# Patient Record
Sex: Female | Born: 1935 | Race: White | Hispanic: No | Smoking: Never smoker
Health system: Southern US, Community
[De-identification: ages and names within clinical notes are randomized; demographics above are authoritative.]

## PROBLEM LIST (undated history)

## (undated) DIAGNOSIS — M1990 Unspecified osteoarthritis, unspecified site: Secondary | ICD-10-CM

## (undated) DIAGNOSIS — E079 Disorder of thyroid, unspecified: Secondary | ICD-10-CM

## (undated) DIAGNOSIS — F0390 Unspecified dementia without behavioral disturbance: Secondary | ICD-10-CM

## (undated) DIAGNOSIS — I10 Essential (primary) hypertension: Secondary | ICD-10-CM

## (undated) HISTORY — PX: KNEE SURGERY: SHX244

---

## 2012-06-13 ENCOUNTER — Emergency Department (HOSPITAL_BASED_OUTPATIENT_CLINIC_OR_DEPARTMENT_OTHER)
Admission: EM | Admit: 2012-06-13 | Discharge: 2012-06-13 | Disposition: A | Discharge status: Home / Self Care | Payer: Medicare Other | Attending: Emergency Medicine | Admitting: Emergency Medicine

## 2012-06-13 ENCOUNTER — Encounter (HOSPITAL_BASED_OUTPATIENT_CLINIC_OR_DEPARTMENT_OTHER): Payer: Self-pay | Admitting: *Deleted

## 2012-06-13 DIAGNOSIS — Y998 Other external cause status: Secondary | ICD-10-CM | POA: Insufficient documentation

## 2012-06-13 DIAGNOSIS — W540XXA Bitten by dog, initial encounter: Secondary | ICD-10-CM | POA: Insufficient documentation

## 2012-06-13 DIAGNOSIS — M129 Arthropathy, unspecified: Secondary | ICD-10-CM | POA: Insufficient documentation

## 2012-06-13 DIAGNOSIS — F0390 Unspecified dementia without behavioral disturbance: Secondary | ICD-10-CM | POA: Insufficient documentation

## 2012-06-13 DIAGNOSIS — I10 Essential (primary) hypertension: Secondary | ICD-10-CM | POA: Insufficient documentation

## 2012-06-13 DIAGNOSIS — S81811A Laceration without foreign body, right lower leg, initial encounter: Secondary | ICD-10-CM

## 2012-06-13 DIAGNOSIS — E079 Disorder of thyroid, unspecified: Secondary | ICD-10-CM | POA: Insufficient documentation

## 2012-06-13 DIAGNOSIS — Y93E9 Activity, other interior property and clothing maintenance: Secondary | ICD-10-CM | POA: Insufficient documentation

## 2012-06-13 DIAGNOSIS — S81009A Unspecified open wound, unspecified knee, initial encounter: Secondary | ICD-10-CM | POA: Insufficient documentation

## 2012-06-13 HISTORY — DX: Essential (primary) hypertension: I10

## 2012-06-13 HISTORY — DX: Disorder of thyroid, unspecified: E07.9

## 2012-06-13 HISTORY — DX: Unspecified osteoarthritis, unspecified site: M19.90

## 2012-06-13 MED ORDER — LIDOCAINE-EPINEPHRINE 2 %-1:100000 IJ SOLN
20.0000 mL | Freq: Once | INTRAMUSCULAR | Status: AC
  Administered 2012-06-13: 20 mL
  Filled 2012-06-13: qty 1

## 2012-06-13 MED ORDER — AMOXICILLIN-POT CLAVULANATE 875-125 MG PO TABS: 1.0000 | ORAL_TABLET | Freq: Two times a day (BID) | ORAL | Status: DC

## 2012-06-13 MED ORDER — AMOXICILLIN-POT CLAVULANATE 875-125 MG PO TABS
1.0000 | ORAL_TABLET | Freq: Once | ORAL | Status: AC
  Administered 2012-06-13: 1 via ORAL
  Filled 2012-06-13: qty 1

## 2012-06-13 NOTE — ED Notes (Signed)
Patient had two wounds to left lower leg; sutures placed per MD; applied steri strips X2 to lateral laceration; bacitracin applied; 4X4 and gauze wrap applied.

## 2012-06-13 NOTE — ED Notes (Signed)
Pt to room 6 in w/c. Daughter reports pt bitten on right lower leg by her dog, states dog is utd on rabies and all immunizations. Pt assisted to bed and out of pants, into gown. approx 2" crecent sized laceration noted to right outer anterior shin. approx 2" x 1" open laceration noted to inner aspect of right anterior shin, both with minimal active bleeding. Wounds cleansed with wound cleanser pt tolerated well.

## 2012-06-13 NOTE — ED Provider Notes (Signed)
History     CSN: [id number]  Arrival date & time 06/13/12  4098   First MD Initiated Contact with Patient 06/13/12 [phone]      Chief Complaint  Patient presents with  . Animal Bite    (Consider location/radiation/quality/duration/timing/severity/associated sxs/prior treatment) Patient is a 78 y.o. female presenting with animal bite.  Animal Bite    Patient is a 78 year old female with history of dementia who presents today with her daughter for evaluation of a dog bite. Patient gets exercise by walking and sometimes doing limited housework today. Today the patient was trying to sweep when she started sweeping right around the family dog. Patient was bitten on her right shin and calf. The dogs immunizations are all up-to-date. Patient has a 2 inch crescent shaped wound over the right lateral shin with minimal bleeding that is nearly completely approximated. Patient also has a 2 inch laceration over the right medial calf with some minimal bleeding but with one and a half centimeters of gapping of the wound edges. Wound still appears to be relatively superficial. Patient did not have any other injuries and did not fall as a result of the incident. Her last tetanus shot was in 2004. Patient is at her neurologic baseline. There are no bony deformities noted. Pulses are intact distally. There are no other associated or modifying factors. Past Medical History  Diagnosis Date  . Thyroid disease   . Hypertension   . Arthritis     History reviewed. No pertinent past surgical history.  History reviewed. No pertinent family history.  History  Substance Use Topics  . Smoking status: Never Smoker   . Smokeless tobacco: Not on file  . Alcohol Use:     OB History    Grav Para Term Preterm Abortions TAB SAB Ect Mult Living                  Review of Systems  Constitutional: Negative.   HENT: Negative.   Eyes: Negative.   Respiratory: Negative.   Cardiovascular: Negative.     Gastrointestinal: Negative.   Genitourinary: Negative.   Musculoskeletal: Negative.   Skin: Positive for wound.  Neurological: Negative.   Hematological: Negative.   Psychiatric/Behavioral: Negative.   All other systems reviewed and are negative.    Allergies  Codeine  Home Medications   Current Outpatient Rx  Name Route Sig Dispense Refill  . BUSPIRONE HCL 5 MG PO TABS Oral Take 5 mg by mouth 3 (three) times daily.    . CELECOXIB 100 MG PO CAPS Oral Take 100 mg by mouth 2 (two) times daily.    Marland Kitchen HYDROCHLOROTHIAZIDE 12.5 MG PO TABS Oral Take 12.5 mg by mouth daily.    Marland Kitchen LEVOTHYROXINE SODIUM 50 MCG PO TABS Oral Take 50 mcg by mouth daily.    Marland Kitchen LOSARTAN POTASSIUM 25 MG PO TABS Oral Take 25 mg by mouth daily.    Marland Kitchen SIMVASTATIN 20 MG PO TABS Oral Take 40 mg by mouth every evening.       BP 136/68  Pulse 81  Temp 98.2 F (36.8 C) (Oral)  Resp 20  Ht 5' (1.524 m)  Wt 115 lb (52.164 kg)  BMI 22.46 kg/m2  SpO2 97%  Physical Exam  Nursing note and vitals reviewed. GEN: Well-developed, thin, elderly female in no distress HEENT: Atraumatic, normocephalic.  EYES: PERRLA BL, no scleral icterus. NECK: Trachea midline, no meningismus CV: regular rate and rhythm.  PULM: No respiratory distress.   Neuro: cranial nerves grossly  2-12 intact, no abnormalities of strength or sensation, alert and had neurologic baseline MSK: Patient moves all 4 extremities symmetrically Skin: No rashes petechiae, purpura, or jaundice Psych: no abnormality of mood   ED Course  Procedures (including critical care time)  LACERATION REPAIR Performed by: Cyndra Numbers Authorized by: Cyndra Numbers Consent: Verbal consent obtained. Risks and benefits: risks, benefits and alternatives were discussed Consent given by: patient Patient identity confirmed: provided demographic data Prepped and Draped in normal sterile fashion Wound explored  Laceration Location: right lower leg  Laceration Length: 4.4  cm on right lateral shin with 1 mm of gapping, 5.5 cm on medial right calf with some tissue avulsion and 1-1/2 cm of gapping  No Foreign Bodies seen or palpated  Anesthesia: local infiltration  Local anesthetic: lidocaine 2 % with epinephrine  Anesthetic total: 5 ml  Irrigation method: syringe Amount of cleaning: standard  Skin closure: 4-0 Ethilon was used as well as Steri-Strips   Number of sutures: 2 sutures were placed in the right medial calf wound with one Steri-Strip applied to the right anterior shin wound   Technique: Standard application of Steri-Strips was used as well as sterile technique and simple interrupted suture placement for the medial calf wound   Patient tolerance: Patient tolerated the procedure well with no immediate complications.  Labs Reviewed - No data to display No results found.   1. Dog bite   2. Lacerations of multiple sites of right leg       MDM  Patient was evaluated by myself. Based on evaluation patient had 2 skin tears secondary to dog bite. I think the quality of the patient's skin explains the extent of the wound today. She did not have any underlying ecchymosis or bony deformity. Patient did not require imaging. Patient had a tetanus shot that was up-to-date. Given tissue avulsion on the medial calf wound patient did have 2 simple interrupted sutures placed to loosely approximate the wound. One Steri-Strip was placed at the midpoint of the right anterior shin wound for the same reason. Patient had bacitracin applied to the wounds. Patient was bandage to. She received a dose of Augmentin here and was placed on this for the next 7 days. Given her age and diabetes which was reported by family patient was told to see her regular doctor in 48 hours for wound recheck to ensure no interim development of infection. Additionally they were told that the patient didn't see any physician in 7-10 days for suture removal. Patient has dementia and her daughter  reported that she had taken Celebrex for her arthritis today. They did not want anything for pain today. Patient can continue to take over-the-counter Tylenol with her Celebrex at home as needed for pain. Patient was discharged in good condition.        Cyndra Numbers, MD 06/13/12 1049

## 2012-06-13 NOTE — Discharge Instructions (Signed)
Please change bandage on your leg every 12 hours and reapply antibiotic ointment (either Neosporin or bacitracin) at this time. Animal Bite An animal bite can result in a scratch on the skin, deep open cut, puncture of the skin, crush injury, or tearing away of the skin or a body part. Dogs are responsible for most animal bites. Children are bitten more often than adults. An animal bite can range from very mild to more serious. A small bite from your house pet is no cause for alarm. However, some animal bites can become infected or injure a bone or other tissue. You must seek medical care if:  The skin is broken and bleeding does not slow down or stop after 15 minutes.   The puncture is deep and difficult to clean (such as a cat bite).   Pain, warmth, redness, or pus develops around the wound.   The bite is from a stray animal or rodent. There may be a risk of rabies infection.   The bite is from a snake, raccoon, skunk, fox, coyote, or bat. There may be a risk of rabies infection.   The person bitten has a chronic illness such as diabetes, liver disease, or cancer, or the person takes medicine that lowers the immune system.   There is concern about the location and severity of the bite.  It is important to clean and protect an animal bite wound right away to prevent infection. Follow these steps:  Clean the wound with plenty of water and soap.   Apply an antibiotic cream.   Apply gentle pressure over the wound with a clean towel or gauze to slow or stop bleeding.   Elevate the affected area above the heart to help stop any bleeding.   Seek medical care. Getting medical care within 8 hours of the animal bite leads to the best possible outcome.  DIAGNOSIS  Your caregiver will most likely:  Take a detailed history of the animal and the bite injury.   Perform a wound exam.   Take your medical history.  Blood tests or X-rays may be performed. Sometimes, infected bite wounds are  cultured and sent to a lab to identify the infectious bacteria.  TREATMENT  Medical treatment will depend on the location and type of animal bite as well as the patient's medical history. Treatment may include:  Wound care, such as cleaning and flushing the wound with saline solution, bandaging, and elevating the affected area.   Antibiotics.   Tetanus immunization.   Rabies immunization.   Leaving the wound open to heal. This is often done with animal bites, due to the high risk of infection. However, in certain cases, wound closure with stitches, wound adhesive, skin adhesive strips, or staples may be used.  Infected bites that are left untreated may require intravenous (IV) antibiotics and surgical treatment in the hospital. HOME CARE INSTRUCTIONS  Follow your caregiver's instructions for wound care.   Take all medicines as directed.   If your caregiver prescribes antibiotics, take them as directed. Finish them even if you start to feel better.   Follow up with your caregiver for further exams or immunizations as directed.  You may need a tetanus shot if:  You cannot remember when you had your last tetanus shot.   You have never had a tetanus shot.   The injury broke your skin.  If you get a tetanus shot, your arm may swell, get red, and feel warm to the touch. This is common  and not a problem. If you need a tetanus shot and you choose not to have one, there is a rare chance of getting tetanus. Sickness from tetanus can be serious. SEEK MEDICAL CARE IF:  You notice warmth, redness, soreness, swelling, pus discharge, or a bad smell coming from the wound.   You have a red line on the skin coming from the wound.   You have a fever, chills, or a general ill feeling.   You have nausea or vomiting.   You have continued or worsening pain.   You have trouble moving the injured part.   You have other questions or concerns.  MAKE SURE YOU:  Understand these instructions.    Will watch your condition.   Will get help right away if you are not doing well or get worse.  Document Released: 08/23/2011 Document Revised: 11/24/2011 Document Reviewed: 08/23/2011 [facility] Patient Information 2012 [place], [place].Laceration Care, Adult A laceration is a cut or lesion that goes through all layers of the skin and into the tissue just beneath the skin. TREATMENT  Some lacerations may not require closure. Some lacerations may not be able to be closed due to an increased risk of infection. It is important to see your caregiver as soon as possible after an injury to minimize the risk of infection and maximize the opportunity for successful closure. If closure is appropriate, pain medicines may be given, if needed. The wound will be cleaned to help prevent infection. Your caregiver will use stitches (sutures), staples, wound glue (adhesive), or skin adhesive strips to repair the laceration. These tools bring the skin edges together to allow for faster healing and a better cosmetic outcome. However, all wounds will heal with a scar. Once the wound has healed, scarring can be minimized by covering the wound with sunscreen during the day for 1 full year. HOME CARE INSTRUCTIONS  For sutures or staples:  Keep the wound clean and dry.   If you were given a bandage (dressing), you should change it at least once a day. Also, change the dressing if it becomes wet or dirty, or as directed by your caregiver.   Wash the wound with soap and water 2 times a day. Rinse the wound off with water to remove all soap. Pat the wound dry with a clean towel.   After cleaning, apply a thin layer of the antibiotic ointment as recommended by your caregiver. This will help prevent infection and keep the dressing from sticking.   You may shower as usual after the first 24 hours. Do not soak the wound in water until the sutures are removed.   Only take over-the-counter or prescription medicines for pain,  discomfort, or fever as directed by your caregiver.   Get your sutures or staples removed as directed by your caregiver.  For skin adhesive strips:  Keep the wound clean and dry.   Do not get the skin adhesive strips wet. You may bathe carefully, using caution to keep the wound dry.   If the wound gets wet, pat it dry with a clean towel.   Skin adhesive strips will fall off on their own. You may trim the strips as the wound heals. Do not remove skin adhesive strips that are still stuck to the wound. They will fall off in time.  For wound adhesive:  You may briefly wet your wound in the shower or bath. Do not soak or scrub the wound. Do not swim. Avoid periods of heavy perspiration until  the skin adhesive has fallen off on its own. After showering or bathing, gently pat the wound dry with a clean towel.   Do not apply liquid medicine, cream medicine, or ointment medicine to your wound while the skin adhesive is in place. This may loosen the film before your wound is healed.   If a dressing is placed over the wound, be careful not to apply tape directly over the skin adhesive. This may cause the adhesive to be pulled off before the wound is healed.   Avoid prolonged exposure to sunlight or tanning lamps while the skin adhesive is in place. Exposure to ultraviolet light in the first year will darken the scar.   The skin adhesive will usually remain in place for 5 to 10 days, then naturally fall off the skin. Do not pick at the adhesive film.  You may need a tetanus shot if:  You cannot remember when you had your last tetanus shot.   You have never had a tetanus shot.  If you get a tetanus shot, your arm may swell, get red, and feel warm to the touch. This is common and not a problem. If you need a tetanus shot and you choose not to have one, there is a rare chance of getting tetanus. Sickness from tetanus can be serious. SEEK MEDICAL CARE IF:   You have redness, swelling, or increasing  pain in the wound.   You see a red line that goes away from the wound.   You have yellowish-white fluid (pus) coming from the wound.   You have a fever.   You notice a bad smell coming from the wound or dressing.   Your wound breaks open before or after sutures have been removed.   You notice something coming out of the wound such as wood or glass.   Your wound is on your hand or foot and you cannot move a finger or toe.  SEEK IMMEDIATE MEDICAL CARE IF:   Your pain is not controlled with prescribed medicine.   You have severe swelling around the wound causing pain and numbness or a change in color in your arm, hand, leg, or foot.   Your wound splits open and starts bleeding.   You have worsening numbness, weakness, or loss of function of any joint around or beyond the wound.   You develop painful lumps near the wound or on the skin anywhere on your body.  MAKE SURE YOU:   Understand these instructions.   Will watch your condition.   Will get help right away if you are not doing well or get worse.  Document Released: 12/05/2005 Document Revised: 11/24/2011 Document Reviewed: 05/31/2011 [facility] Patient Information 2012 [place], [place].

## 2012-06-22 ENCOUNTER — Encounter (HOSPITAL_BASED_OUTPATIENT_CLINIC_OR_DEPARTMENT_OTHER): Payer: Self-pay | Admitting: *Deleted

## 2012-06-22 ENCOUNTER — Emergency Department (HOSPITAL_BASED_OUTPATIENT_CLINIC_OR_DEPARTMENT_OTHER)
Admission: EM | Admit: 2012-06-22 | Discharge: 2012-06-22 | Disposition: A | Discharge status: Home / Self Care | Payer: Medicare Other | Attending: Emergency Medicine | Admitting: Emergency Medicine

## 2012-06-22 DIAGNOSIS — Z4802 Encounter for removal of sutures: Secondary | ICD-10-CM | POA: Insufficient documentation

## 2012-06-22 DIAGNOSIS — W540XXA Bitten by dog, initial encounter: Secondary | ICD-10-CM

## 2012-06-22 NOTE — ED Provider Notes (Signed)
I saw and evaluated the patient, reviewed the resident's note and I agree with the findings and plan.   .Face to face Exam:  General:  Awake HEENT:  Atraumatic Resp:  Normal effort Abd:  Nondistended Neuro:No focal weakness Lymph: No adenopathy   Nelia Shi, MD 06/22/12 1106

## 2012-06-22 NOTE — ED Provider Notes (Signed)
History     CSN: [id number]  Arrival date & time 06/22/12  5188   First MD Initiated Contact with Patient 06/22/12 1033      Chief Complaint  Patient presents with  . Suture / Staple Removal    (Consider location/radiation/quality/duration/timing/severity/associated sxs/prior treatment) Patient is a 78 y.o. female presenting with suture removal.  Suture / Staple Removal     Mrs. Bultman is a 78 y.o. F with dementia who presents for wound evaluation and suture removal. She was bitten by a dog on 6/26 and had two wounds on her right lower extremity. Sutures were placed on the anterior-medial wound and the lateral-anterior wound was closed with steri-strips. Since that time her daughter has been caring for her wound with daily application of bacitracin and a bandage. Her daughter denies any drainage from the wound or dehiscence. The patient has been afebrile and without complications since the bite.    Past Medical History  Diagnosis Date  . Thyroid disease   . Hypertension   . Arthritis     History reviewed. No pertinent past surgical history.  History reviewed. No pertinent family history.  History  Substance Use Topics  . Smoking status: Never Smoker   . Smokeless tobacco: Not on file  . Alcohol Use:     OB History    Grav Para Term Preterm Abortions TAB SAB Ect Mult Living                  Review of Systems  All other systems reviewed and are negative.    Allergies  Codeine  Home Medications   Current Outpatient Rx  Name Route Sig Dispense Refill  . AMOXICILLIN-POT CLAVULANATE 875-125 MG PO TABS Oral Take 1 tablet by mouth 2 (two) times daily. 14 tablet 0  . BUSPIRONE HCL 5 MG PO TABS Oral Take 5 mg by mouth 3 (three) times daily.    . CELECOXIB 100 MG PO CAPS Oral Take 100 mg by mouth 2 (two) times daily.    Marland Kitchen HYDROCHLOROTHIAZIDE 12.5 MG PO TABS Oral Take 12.5 mg by mouth daily.    Marland Kitchen LEVOTHYROXINE SODIUM 50 MCG PO TABS Oral Take 50 mcg by mouth daily.     Marland Kitchen LOSARTAN POTASSIUM 25 MG PO TABS Oral Take 25 mg by mouth daily.    Marland Kitchen SIMVASTATIN 20 MG PO TABS Oral Take 40 mg by mouth every evening.       BP 122/59  Pulse 77  Temp 98 F (36.7 C) (Oral)  Resp 18  SpO2 96%  Physical Exam  Constitutional: She appears well-developed and well-nourished. No distress.  Skin: She is not diaphoretic.     Psychiatric: She has a normal mood and affect. Her behavior is normal. Her speech is not delayed. Cognition and memory are impaired. She exhibits abnormal recent memory and abnormal remote memory.    ED Course  SUTURE REMOVAL Date/Time: 06/22/2012 10:56 AM Performed by: Garnetta Buddy Authorized by: Garnetta Buddy Consent: Verbal consent obtained. Written consent not obtained. Risks and benefits: risks, benefits and alternatives were discussed Consent given by: power of attorney Body area: lower extremity Wound Appearance: clean and pink Sutures Removed: 2 Staples Removed: 0 Post-removal: Steri-Strips applied Facility: sutures placed in this facility Patient tolerance: Patient tolerated the procedure well with no immediate complications.   (including critical care time)  Labs Reviewed - No data to display No results found.   1. Dog bite       MDM  The patient has two well healing lower extremity wounds from a dog bite on 6/26. Her sutures were removed and steri-strips were placed. No need for continued antibiotics at this point as no evidence of infection.         Garnetta Buddy, MD 06/22/12 1104

## 2012-06-22 NOTE — ED Notes (Signed)
Pt amb to room 12 with slow steady gait in nad. Daughter reports she was told to have sutures to rle removed in 7 - 10 days, today is day 9. Sutures are dry and intact.

## 2013-05-03 ENCOUNTER — Encounter: Payer: Self-pay | Admitting: Podiatry

## 2013-05-03 ENCOUNTER — Ambulatory Visit (INDEPENDENT_AMBULATORY_CARE_PROVIDER_SITE_OTHER): Payer: Medicare Other | Admitting: Podiatry

## 2013-05-03 VITALS — BP 104/51 | HR 63

## 2013-05-03 DIAGNOSIS — L8990 Pressure ulcer of unspecified site, unspecified stage: Secondary | ICD-10-CM

## 2013-05-03 DIAGNOSIS — I739 Peripheral vascular disease, unspecified: Secondary | ICD-10-CM

## 2013-05-03 DIAGNOSIS — B351 Tinea unguium: Secondary | ICD-10-CM | POA: Insufficient documentation

## 2013-05-03 DIAGNOSIS — L8992 Pressure ulcer of unspecified site, stage 2: Secondary | ICD-10-CM | POA: Insufficient documentation

## 2013-05-03 DIAGNOSIS — M25579 Pain in unspecified ankle and joints of unspecified foot: Secondary | ICD-10-CM

## 2013-05-03 DIAGNOSIS — L84 Corns and callosities: Secondary | ICD-10-CM

## 2013-05-03 NOTE — Progress Notes (Signed)
She fell ln knee on April 27 and got more edema on lower limbs.  Taking amoxicillin stating yesterday and caused diarrhea.  No enough room in shoes for swelling. Developed a lesion on right great toe.  Callus under 1st MPJ and light callus under 5th right. Dystrophic nails on both great toes.  Amputated 2nd toe right in 2006 due toe none healing ulcer.  Edema both feet and leg R>L.  SUBJECTIVE: 79 y.o. year old female accompanied by her daughter presents for a lesion on right great toe. Patient is wearing diabetic shoes.  Patient is referred by Dr. Andria Meuse.  Daughter stated that patient fell on 4/27 and hurt right knee that resulted in swelling of right lower limb.  She was also treated with antibiotics for the blister on right great toe. Started on Amoxicillin yesterday, which made her to have diarrhea.   Reviewed and noted Medications, Allergies, Medical and Surgical histories.   OBJECTIVE: DERMATOLOGIC EXAMINATION: Nails: Severely dystrophic nail on both great toes. Skin: Dry bled callus prominent in size on right distal dorsal aspect of the hallux.  Thick hard protruding callus under the first MPJ right foot.   VASCULAR EXAMINATION OF LOWER LIMBS: Pedal pulses: All pedal pulses are not palpable all areas of feet, including DP and PT bilateral.  Swollen lower limb R>L without temperature increase. Both feet are cool. NEUROLOGIC EXAMINATION OF THE LOWER LIMBS: Fail to response to Monofilament sensory testing bilateral.  MUSCULOSKELETAL EXAMINATION: Loss of 2nd digit right due to none healing ulcer 2006. Hyperextended right hallux with dorsal distal lesion.  ASSESSMENT: Ulcerating callus right great toe with loss of dermal layer. Stage II. No associated soft tissue swelling, edema or erythema.  Pre-ulcerative lesion sub 1 right foot. Deformed mycotic nails both great toe. PVD. Peripheral neuropathy with loss of protective sensory perception.  PLAN: Reviewed the findings with  daughter. All lesions debrided.  All hypertrophic nails debrided.  Right great toe lesion dressed with Amerigel cream. [place] care instruction given to the daughter. Right foot placed in open toed surgical shoes. Will follow up in two weeks.

## 2013-05-03 NOTE — Patient Instructions (Addendum)
Seen for elongated nails and right toe lesion. Findings include ulcerating callus on right great toe. Poor circulation with poor sensory perception, unable to protect against injury such as chronic pressure induced skin lesions. Please stay in open toed surgical shoe on right foot till the lesion is healed. Clean the right great toe wound through normal bath. Apply Antibiotic ointment and cover with gauge or band aid. Return in 2 weeks.

## 2013-05-17 ENCOUNTER — Ambulatory Visit (INDEPENDENT_AMBULATORY_CARE_PROVIDER_SITE_OTHER): Payer: Medicare Other | Admitting: Podiatry

## 2013-05-17 VITALS — BP 126/68 | HR 63

## 2013-05-17 DIAGNOSIS — L8990 Pressure ulcer of unspecified site, unspecified stage: Secondary | ICD-10-CM

## 2013-05-17 DIAGNOSIS — I739 Peripheral vascular disease, unspecified: Secondary | ICD-10-CM

## 2013-05-17 DIAGNOSIS — L8992 Pressure ulcer of unspecified site, stage 2: Secondary | ICD-10-CM

## 2013-05-17 NOTE — Progress Notes (Signed)
Subjective:  79 year old female presents accompanied by her daughter for follow up on digital lesion left great toe. Daughter stated that right ankle had a sore for about 3 months. Patient lays down on that side while sleeping.   Objective: Left hallucal distal end healed and dry, covered with dry scab. Noted of plantar callus under the first MPJ left. New lesion  over right lateral malleoli, about 0.6cm diameter ulcerated deep down to subdermal layer. Yellow moist base, surround with erythematous soft tissue.  Pedal pulses are not palpable with positive signs of trophic changes. .  Both feet are swollen.   Assessment: 1. Pressure ulcer right lateral ankle, 0.5cm deep down to subdermal layer, Stage II. 2. PVD.  Plan: Reviewed clinical findings and home care instructions. Wound cleansed with 50% H2O2, aperture pad applied with Amerigel ointment dressing.  Will check back in 2 weeks.

## 2013-05-29 ENCOUNTER — Ambulatory Visit (INDEPENDENT_AMBULATORY_CARE_PROVIDER_SITE_OTHER): Payer: Medicare Other | Admitting: Podiatry

## 2013-05-29 VITALS — BP 110/62 | HR 70

## 2013-05-29 DIAGNOSIS — I739 Peripheral vascular disease, unspecified: Secondary | ICD-10-CM

## 2013-05-29 DIAGNOSIS — L8990 Pressure ulcer of unspecified site, unspecified stage: Secondary | ICD-10-CM

## 2013-05-29 DIAGNOSIS — L8992 Pressure ulcer of unspecified site, stage 2: Secondary | ICD-10-CM

## 2013-05-29 NOTE — Progress Notes (Signed)
Subjective: 79 year old patient came in with her daughter. Wearing open toed surgical shoe on right as instructed during last visit. Daughter stated that toes are doing well. Had much redness over the right lateral ankle where aperture pad was applied. She thinks the skin had allergic reaction to the adhesive material.   Objective: Thin shiny skin with trophic changes on both feet. Pedal pulses are not palpable. Both feet and ankles have mild edema. Calluses: Long right hallux with callused lesion at the distal end (healed old ulcer), and under first MPJ right foot.   Skin lesions; #1. Right lateral ankle ulcer decreased in size to 0.4cm in diameter, present with mild erythema along the marginal border with clear serosanguinous weeping. #2. Right hallux distal end has dry callus over the old ulcer site. Area is dry and not open skin.  Assessment: Healed right hallucal lesion. Improving right lateral malleoli ulcer, now 0.4cm in diameter without sign of active infection.  PVD  Plan: Reviewed findings and treatment options. Instructed to daughter to make aperture pad using patient's socks. Instructed to use thick Eucerin cream to dry callused area. All excess dry callused lesions debrided from distal end of right hallux and under the first MPJ right.. Right lateral ankle dressed with Amerigel ointment.

## 2013-06-19 ENCOUNTER — Ambulatory Visit: Payer: Medicare Other | Admitting: Podiatry

## 2014-02-12 ENCOUNTER — Encounter: Payer: Self-pay | Admitting: Podiatry

## 2014-02-12 ENCOUNTER — Ambulatory Visit (INDEPENDENT_AMBULATORY_CARE_PROVIDER_SITE_OTHER): Payer: Medicare Other | Admitting: Podiatry

## 2014-02-12 VITALS — BP 154/69 | HR 61 | Ht 61.0 in | Wt 115.0 lb

## 2014-02-12 DIAGNOSIS — I739 Peripheral vascular disease, unspecified: Secondary | ICD-10-CM

## 2014-02-12 DIAGNOSIS — M79609 Pain in unspecified limb: Secondary | ICD-10-CM

## 2014-02-12 DIAGNOSIS — M79606 Pain in leg, unspecified: Secondary | ICD-10-CM

## 2014-02-12 DIAGNOSIS — B351 Tinea unguium: Secondary | ICD-10-CM

## 2014-02-12 DIAGNOSIS — L8992 Pressure ulcer of unspecified site, stage 2: Secondary | ICD-10-CM

## 2014-02-12 DIAGNOSIS — L89609 Pressure ulcer of unspecified heel, unspecified stage: Secondary | ICD-10-CM

## 2014-02-12 NOTE — Progress Notes (Signed)
80 year old female patient was accompanied by her daughter.   Objective: All nails are hypertrophic.  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 Pre ulcerative callus under the 1st MPJ right. Thick dystrophic excess callus distal end of right hallux. Left first MPJ bunion site is red from shoe friction.  Pedal pulses are not palpable bilateral. Mild forefoot edema on left lateral 1/2 foot.  Assessment: Pre ulcerative callus with skin break down sub 1 right. Excess callus right hallux distal end. Hypertrophic nails x 10.   Plan: All lesions and nails debrided. Aperture pad with Amerigel cream applied to sub 1 right. Instructed to make a hole to left shoe to accommodate enlarged bunion. Daughter will take her to her Personal assistantshoe maker.  Keep the pad for one week and return in one month.

## 2014-02-12 NOTE — Patient Instructions (Signed)
Seen for hypertrophic nails and pre-ulcerative callus. All debrided and padded. Keep the pad for at least one week. Return in 1 month.

## 2014-03-12 ENCOUNTER — Encounter: Payer: Self-pay | Admitting: Podiatry

## 2014-03-12 ENCOUNTER — Ambulatory Visit (INDEPENDENT_AMBULATORY_CARE_PROVIDER_SITE_OTHER): Payer: Medicare Other | Admitting: Podiatry

## 2014-03-12 VITALS — BP 124/57 | HR 54

## 2014-03-12 DIAGNOSIS — L8992 Pressure ulcer of unspecified site, stage 2: Secondary | ICD-10-CM

## 2014-03-12 DIAGNOSIS — I739 Peripheral vascular disease, unspecified: Secondary | ICD-10-CM

## 2014-03-12 DIAGNOSIS — L89609 Pressure ulcer of unspecified heel, unspecified stage: Secondary | ICD-10-CM

## 2014-03-12 NOTE — Patient Instructions (Signed)
Old lesion is healed well. Noted of right ankle ulcer. Need daily wound care. Keep the pad for the next 2 weeks. Remove the pad before returning to the office.

## 2014-03-12 NOTE — Progress Notes (Signed)
Subjective:  Patient came in with her daughter for follow up on plantar ulcer. Existing lesion is all healed and covered with callus. New lesion on right lateral ankle noted.  Objective: 0.8cm opening at lateral malleoli deep down to subcutaneous level.  No associated cellulites or edema noted from the left ankle lesion.  Pedal pulses are not palpable. Mild forefoot edema on left foot noted.   Assessment: Pressure Ulcer left lateral ankle stage II, new lesion.  Plan: All callused area debrided. Home care instruction provided to her daughter.  Aperture pad with dressing applied to right lateral ankle lesion.

## 2014-03-26 ENCOUNTER — Ambulatory Visit (INDEPENDENT_AMBULATORY_CARE_PROVIDER_SITE_OTHER): Payer: Medicare Other | Admitting: Podiatry

## 2014-03-26 ENCOUNTER — Encounter: Payer: Self-pay | Admitting: Podiatry

## 2014-03-26 VITALS — BP 119/69 | HR 77

## 2014-03-26 DIAGNOSIS — I739 Peripheral vascular disease, unspecified: Secondary | ICD-10-CM

## 2014-03-26 DIAGNOSIS — L8992 Pressure ulcer of unspecified site, stage 2: Secondary | ICD-10-CM

## 2014-03-26 DIAGNOSIS — L8990 Pressure ulcer of unspecified site, unspecified stage: Secondary | ICD-10-CM

## 2014-03-26 NOTE — Progress Notes (Signed)
Subjective: 80 year old female presents with her daughter for 2 weeks follow up on left lateral ankle ulcer. Daughter stated that she followed instruction and changed dressing daily.  Objective: Pedal pulses are not palpable. Missing 2nd right. Red skin with dark discolored toe nail 3rd right. Ulcerated skin with 2mm opening in center with yellow necrotic tissue on right lateral malleoli.  No expanded redness or active drainage noted. Plantar callus under 1st and 4th right.  Assessment: Slow healing malleoli ulcer right ankle. PVD. Recent trauma 3rd distal end possible from shoe pressure.  Plan: New aperture pad 1/4" thickness dispensed to place while in bed to remove pressure. All calluses debrided. During the day, just keep light dressing with antibiotic cream. Return in 3 weeks.

## 2014-03-26 NOTE — Patient Instructions (Signed)
Follow up for ulcer on right ankle ulcer. Slow improvement. Continue with daily wound care using antibiotic ointment and aperture pad.  Noted of injured toe with dark nail and red skin. Monitor 3rd digit right for any drainage.  Return in 3 weeks or sooner if problem arise.

## 2014-03-31 ENCOUNTER — Emergency Department (HOSPITAL_BASED_OUTPATIENT_CLINIC_OR_DEPARTMENT_OTHER): Payer: Medicare Other

## 2014-03-31 ENCOUNTER — Encounter (HOSPITAL_BASED_OUTPATIENT_CLINIC_OR_DEPARTMENT_OTHER): Payer: Self-pay | Admitting: Emergency Medicine

## 2014-03-31 ENCOUNTER — Emergency Department (HOSPITAL_BASED_OUTPATIENT_CLINIC_OR_DEPARTMENT_OTHER)
Admission: EM | Admit: 2014-03-31 | Discharge: 2014-03-31 | Disposition: A | Discharge status: Home / Self Care | Payer: Medicare Other | Attending: Emergency Medicine | Admitting: Emergency Medicine

## 2014-03-31 DIAGNOSIS — Z79899 Other long term (current) drug therapy: Secondary | ICD-10-CM | POA: Insufficient documentation

## 2014-03-31 DIAGNOSIS — F0280 Dementia in other diseases classified elsewhere without behavioral disturbance: Secondary | ICD-10-CM | POA: Insufficient documentation

## 2014-03-31 DIAGNOSIS — R2689 Other abnormalities of gait and mobility: Secondary | ICD-10-CM

## 2014-03-31 DIAGNOSIS — I10 Essential (primary) hypertension: Secondary | ICD-10-CM | POA: Insufficient documentation

## 2014-03-31 DIAGNOSIS — Z8739 Personal history of other diseases of the musculoskeletal system and connective tissue: Secondary | ICD-10-CM | POA: Insufficient documentation

## 2014-03-31 DIAGNOSIS — E079 Disorder of thyroid, unspecified: Secondary | ICD-10-CM | POA: Insufficient documentation

## 2014-03-31 DIAGNOSIS — G309 Alzheimer's disease, unspecified: Principal | ICD-10-CM | POA: Insufficient documentation

## 2014-03-31 HISTORY — DX: Unspecified dementia, unspecified severity, without behavioral disturbance, psychotic disturbance, mood disturbance, and anxiety: F03.90

## 2014-03-31 LAB — URINALYSIS, ROUTINE W REFLEX MICROSCOPIC
BILIRUBIN URINE: NEGATIVE
Glucose, UA: NEGATIVE mg/dL
Hgb urine dipstick: NEGATIVE
KETONES UR: NEGATIVE mg/dL
Leukocytes, UA: NEGATIVE
NITRITE: NEGATIVE
PH: 5.5 (ref 5.0–8.0)
PROTEIN: NEGATIVE mg/dL
Specific Gravity, Urine: 1.017 (ref 1.005–1.030)
UROBILINOGEN UA: 0.2 mg/dL (ref 0.0–1.0)

## 2014-03-31 LAB — COMPREHENSIVE METABOLIC PANEL
ALK PHOS: 59 U/L (ref 39–117)
ALT: 16 U/L (ref 0–35)
AST: 29 U/L (ref 0–37)
Albumin: 3.4 g/dL — ABNORMAL LOW (ref 3.5–5.2)
BILIRUBIN TOTAL: 0.7 mg/dL (ref 0.3–1.2)
BUN: 26 mg/dL — AB (ref 6–23)
CHLORIDE: 103 meq/L (ref 96–112)
CO2: 28 meq/L (ref 19–32)
Calcium: 9.9 mg/dL (ref 8.4–10.5)
Creatinine, Ser: 1 mg/dL (ref 0.50–1.10)
GFR, EST AFRICAN AMERICAN: 54 mL/min — AB (ref >90)
GFR, EST NON AFRICAN AMERICAN: 47 mL/min — AB (ref >90)
GLUCOSE: 142 mg/dL — AB (ref 70–99)
POTASSIUM: 4.3 meq/L (ref 3.7–5.3)
SODIUM: 142 meq/L (ref 137–147)
Total Protein: 6.6 g/dL (ref 6.0–8.3)

## 2014-03-31 LAB — CBC WITH DIFFERENTIAL/PLATELET
Basophils Absolute: 0 K/uL (ref 0.0–0.1)
Basophils Relative: 0 % (ref 0–1)
Eosinophils Absolute: 0.1 K/uL (ref 0.0–0.7)
Eosinophils Relative: 1 % (ref 0–5)
HCT: 39.4 % (ref 36.0–46.0)
Hemoglobin: 13 g/dL (ref 12.0–15.0)
Lymphocytes Relative: 20 % (ref 12–46)
Lymphs Abs: 1.5 K/uL (ref 0.7–4.0)
MCH: 31.3 pg (ref 26.0–34.0)
MCHC: 33 g/dL (ref 30.0–36.0)
MCV: 94.9 fL (ref 78.0–100.0)
Monocytes Absolute: 1.1 K/uL — ABNORMAL HIGH (ref 0.1–1.0)
Monocytes Relative: 14 % — ABNORMAL HIGH (ref 3–12)
Neutro Abs: 5 K/uL (ref 1.7–7.7)
Neutrophils Relative %: 65 % (ref 43–77)
Platelets: 206 K/uL (ref 150–400)
RBC: 4.15 MIL/uL (ref 3.87–5.11)
RDW: 12.6 % (ref 11.5–15.5)
WBC: 7.7 K/uL (ref 4.0–10.5)

## 2014-03-31 LAB — TROPONIN I: Troponin I: <0.3 ng/mL (ref <0.30)

## 2014-03-31 NOTE — ED Notes (Signed)
Pts daughter reports hx of dementia.  States that pt has been leaning to the (R) x 1 year.  Worsening x 2 days.  Pt A/O per her norm.  Pt has been evaluated for same before with no acute findings.

## 2014-03-31 NOTE — ED Notes (Signed)
Patient ambulating per her normal.

## 2014-03-31 NOTE — Discharge Instructions (Signed)
Please recheck with her physician this week.  Fall Prevention and Home Safety Falls cause injuries and can affect all age groups. It is possible to prevent falls.  HOW TO PREVENT FALLS  Wear shoes with rubber soles that do not have an opening for your toes.  Keep the inside and outside of your house well lit.  Use night lights throughout your home.  Remove clutter from floors.  Clean up floor spills.  Remove throw rugs or fasten them to the floor with carpet tape.  Do not place electrical cords across pathways.  Put grab bars by your tub, shower, and toilet. Do not use towel bars as grab bars.  Put handrails on both sides of the stairway. Fix loose handrails.  Do not climb on stools or stepladders, if possible.  Do not wax your floors.  Repair uneven or unsafe sidewalks, walkways, or stairs.  Keep items you use a lot within reach.  Be aware of pets.  Keep emergency numbers next to the telephone.  Put smoke detectors in your home and near bedrooms. Ask your doctor what other things you can do to prevent falls. Document Released: 10/01/2009 Document Revised: 06/05/2012 Document Reviewed: 03/06/2012 [facility] Patient Information 2014 [place], [place].

## 2014-03-31 NOTE — ED Provider Notes (Signed)
CSN: [id number]     Arrival date & time 03/31/14  1026 History   First MD Initiated Contact with Patient 03/31/14 1028      Level V caveat do to dementia  (Consider location/radiation/quality/duration/timing/severity/associated sxs/prior Treatment) The history is provided by a caregiver and a relative.   This is a 80 year old female with a history of Alzheimer's dementia who presents today with her daughter who states that she is leaning to the right. Daughter states this began about a year ago and at that time that no cause for it was found. The daughter feels that she is leaning more to the right over the past 3 days. She states that she looks like she may fall out of her chair. She has a unsteady but stable gait according to her daughter. She is ambulatory without assistance. The patient is unable to give any history on her own. The daughter states that she is at her baseline mental status and behavior. She has not struck her head, had complaints of chest pain or dyspnea, nausea vomiting, or diarrhea. She has not had a fever and has been eating as usual. Past Medical History  Diagnosis Date  . Thyroid disease   . Hypertension   . Arthritis   . Dementia    Past Surgical History  Procedure Laterality Date  . Knee surgery     History reviewed. No pertinent family history. History  Substance Use Topics  . Smoking status: Never Smoker   . Smokeless tobacco: Never Used  . Alcohol Use: No   OB History   Grav Para Term Preterm Abortions TAB SAB Ect Mult Living                 Review of Systems  Unable to perform ROS Constitutional: Negative.        Patient is unreliable for review of systems because of dementia but some review of systems is obtained indirectly to her daughter.  HENT: Negative.   Eyes: Negative for discharge and redness.  Respiratory: Negative for cough.   Cardiovascular: Negative for leg swelling.  Gastrointestinal: Negative for vomiting and diarrhea.  Endocrine:  Negative.   Skin: Negative for color change and pallor.  Allergic/Immunologic: Negative.   Hematological: Negative.   Psychiatric/Behavioral: Negative.       Allergies  Codeine and Ibuprofen  Home Medications   Current Outpatient Rx  Name  Route  Sig  Dispense  Refill  . acetaminophen (TYLENOL) 500 MG tablet   Oral   Take 500 mg by mouth daily.         . busPIRone (BUSPAR) 5 MG tablet   Oral   Take 5 mg by mouth 3 (three) times daily.         . calcium-vitamin D 250-100 MG-UNIT per tablet   Oral   Take 1 tablet by mouth 2 (two) times daily.         Marland Kitchen levothyroxine (SYNTHROID, LEVOTHROID) 50 MCG tablet   Oral   Take 75 mcg by mouth daily.          Marland Kitchen losartan (COZAAR) 25 MG tablet   Oral   Take 25 mg by mouth daily.         . Multiple Vitamin (MULTIVITAMIN) tablet   Oral   Take 1 tablet by mouth daily.         . simvastatin (ZOCOR) 20 MG tablet   Oral   Take 40 mg by mouth every evening.  BP 136/73  Temp(Src) 97.4 F (36.3 C) (Oral)  Resp 18  SpO2 96% Physical Exam  Nursing note and vitals reviewed. Constitutional: She appears well-developed and well-nourished.  HENT:  Head: Normocephalic and atraumatic.  Right Ear: External ear normal.  Left Ear: External ear normal.  Nose: Nose normal.  Mouth/Throat: Oropharynx is clear and moist.  Eyes: Conjunctivae and EOM are normal. Pupils are equal, round, and reactive to light.  Neck: Normal range of motion. Neck supple.  Cardiovascular: Normal rate, regular rhythm, normal heart sounds and intact distal pulses.   Pulmonary/Chest: Effort normal and breath sounds normal.  Abdominal: Soft. Bowel sounds are normal.  Musculoskeletal:  Decreased range of motion bilateral shoulders No visual abnormalities of bones or joints and no tenderness to palpation she has some decreased movement of her shoulders but this appears to do to chronic conditions per her daughter. She actually moves her hips  knees ankles elbows and wrists.  Neurological: She is alert. She displays normal reflexes. No cranial nerve deficit. She exhibits normal muscle tone. Coordination normal.  Patient's strength is 5 out of 5 to resistance of bilateral upper extremities and lower extremities    ED Course  Procedures (including critical care time) Labs Review Labs Reviewed  CBC WITH DIFFERENTIAL - Abnormal; Notable for the following:    Monocytes Relative 14 (*)    Monocytes Absolute 1.1 (*)    All other components within normal limits  COMPREHENSIVE METABOLIC PANEL - Abnormal; Notable for the following:    Glucose, Bld 142 (*)    BUN 26 (*)    Albumin 3.4 (*)    GFR calc non Af Amer 47 (*)    GFR calc Af Amer 54 (*)    All other components within normal limits  URINALYSIS, ROUTINE W REFLEX MICROSCOPIC  TROPONIN I   Imaging Review Dg Chest 2 View  03/31/2014   CLINICAL DATA:  Weakness.  EXAM: CHEST  2 VIEW  COMPARISON:  None.  FINDINGS: The heart size and mediastinal contours are within normal limits. Both lungs are clear. Severe compression deformity of lower thoracic vertebral body is noted most consistent with old fracture.  IMPRESSION: No acute cardiopulmonary abnormality seen.   Electronically Signed   By: Roque LiasJames  Green M.D.   On: 03/31/2014 11:24   Ct Head Wo Contrast  03/31/2014   CLINICAL DATA:  Dementia.  Leaning to the right.  EXAM: CT HEAD WITHOUT CONTRAST  TECHNIQUE: Contiguous axial images were obtained from the base of the skull through the vertex without intravenous contrast.  COMPARISON:  No priors.  FINDINGS: Moderate cerebral and cerebellar atrophy. Patchy and confluent areas of decreased attenuation are noted throughout the deep and periventricular white matter of the cerebral hemispheres bilaterally, compatible with chronic microvascular ischemic disease. Multifocal dural calcifications are incidentally noted. No acute intracranial abnormalities. Specifically, no evidence of acute  intracranial hemorrhage, no definite findings of acute/subacute cerebral ischemia, no mass, mass effect, hydrocephalus or abnormal intra or extra-axial fluid collections. Visualized paranasal sinuses and mastoids are well pneumatized. No acute displaced skull fractures are identified.  IMPRESSION: 1. No acute intracranial abnormalities. 2. Moderate cerebral and cerebellar atrophy with chronic microvascular ischemic changes of the cerebral white matter.   Electronically Signed   By: Trudie Reedaniel  Entrikin M.D.   On: 03/31/2014 11:28     EKG Interpretation   Date/Time:  Monday March 31 2014 10:35:17 EDT Ventricular Rate:  64 PR Interval:  168 QRS Duration: 140 QT Interval:  418 QTC Calculation:  431 R Axis:   -41 Text Interpretation:  Normal sinus rhythm Left axis deviation Left bundle  branch block Abnormal ECG Confirmed by Taylyn Brame MD, Duwayne HeckANIELLE ([id number]) on  03/31/2014 11:10:46 AM      MDM   Final diagnoses:  Balance problem    80 year old female who reportedly is listing to the right. She has no focal neurological deficits noted on exam and head CT is significant for moderate cerebral and cerebellar atrophy without acute bleed or acute ischemic changes noted. Her EKG shows a left bundle branch block which is new from her first previous EKG that was imported from her physician's office dated 06/05/2013. There've been no complaints of shortness of breath or chest pain and her troponin here is normal. I discussed this at [place] with her daughter and she voices understanding of need for followup. Patient was ambulated here and is reportedly at her baseline ambulation status which is somewhat unsteady. Daughter is advised regarding return precautions and voices understanding.    Hilario Quarryanielle S Briane Birden, MD 03/31/14 [phone]

## 2014-03-31 NOTE — ED Notes (Signed)
MD at bedside. 

## 2014-04-16 ENCOUNTER — Encounter: Payer: Self-pay | Admitting: Podiatry

## 2014-04-16 ENCOUNTER — Ambulatory Visit: Payer: Medicare Other | Admitting: Podiatry

## 2014-04-16 ENCOUNTER — Ambulatory Visit (INDEPENDENT_AMBULATORY_CARE_PROVIDER_SITE_OTHER): Payer: Medicare Other | Admitting: Podiatry

## 2014-04-16 VITALS — BP 143/58 | HR 62

## 2014-04-16 DIAGNOSIS — L8990 Pressure ulcer of unspecified site, unspecified stage: Secondary | ICD-10-CM

## 2014-04-16 DIAGNOSIS — L8992 Pressure ulcer of unspecified site, stage 2: Secondary | ICD-10-CM

## 2014-04-16 DIAGNOSIS — I739 Peripheral vascular disease, unspecified: Secondary | ICD-10-CM

## 2014-04-16 NOTE — Progress Notes (Signed)
Seen for follow up on right ankle ulcer. Wound size in decreasing.  Daughter is keeping up with aperture pad at night to relieve pressure to right ankle bone.   Objective: Improved right ankle ulcer with aperture pad.  Nails are mildly hypertrophic. Hard callus under 1st MPJ right.  Assessment: Improving ulcer right lateral ankle.   Plan: Continue with current level of care. All nails and calluses debrided.  Aperture pad dispensed.

## 2014-04-16 NOTE — Patient Instructions (Signed)
Noted healing ulcer right ankle. Continue to use aperture pad at night. Return in 6 weeks.

## 2014-05-28 ENCOUNTER — Encounter: Payer: Self-pay | Admitting: Podiatry

## 2014-05-28 ENCOUNTER — Ambulatory Visit (INDEPENDENT_AMBULATORY_CARE_PROVIDER_SITE_OTHER): Payer: Medicare Other | Admitting: Podiatry

## 2014-05-28 VITALS — BP 150/71 | HR 63

## 2014-05-28 DIAGNOSIS — L8990 Pressure ulcer of unspecified site, unspecified stage: Secondary | ICD-10-CM

## 2014-05-28 DIAGNOSIS — L8992 Pressure ulcer of unspecified site, stage 2: Secondary | ICD-10-CM

## 2014-05-28 DIAGNOSIS — L84 Corns and callosities: Secondary | ICD-10-CM

## 2014-05-28 DIAGNOSIS — I739 Peripheral vascular disease, unspecified: Secondary | ICD-10-CM

## 2014-05-28 NOTE — Patient Instructions (Signed)
Follow up on right ankle lesion. Healing well and formed small scab. Keep the area moisturized.  Debrided build up callus from right great toe and under the big joint right foot. Return in 6 weeks.

## 2014-05-28 NOTE — Progress Notes (Signed)
Subjective: 80 year old female presents with her daughter for follow up on right ankle ulcer.  Wound has dried and formed small scab.  Daughter has stopped using pad.   Objective: Improved right ankle ulcer, formed small dry scab.  Hard callus under 1st MPJ and distal end of right hallux with intradermal bleeding right foot.  Pedal pulses are not palpable. Mild forefoot edema on left foot.   Assessment: Improved ulcer right lateral ankle.  Pre ulcerative callus under right great toe and under first MPJ right foot.  PVD.  Plan: Continue with current level of care.  All calluses debrided.  Return in 6 weeks.

## 2014-06-02 ENCOUNTER — Emergency Department (HOSPITAL_BASED_OUTPATIENT_CLINIC_OR_DEPARTMENT_OTHER): Payer: Medicare Other

## 2014-06-02 ENCOUNTER — Encounter (HOSPITAL_BASED_OUTPATIENT_CLINIC_OR_DEPARTMENT_OTHER): Payer: Self-pay | Admitting: Emergency Medicine

## 2014-06-02 ENCOUNTER — Emergency Department (HOSPITAL_BASED_OUTPATIENT_CLINIC_OR_DEPARTMENT_OTHER)
Admission: EM | Admit: 2014-06-02 | Discharge: 2014-06-03 | Disposition: A | Discharge status: Home / Self Care | Payer: Medicare Other | Attending: Emergency Medicine | Admitting: Emergency Medicine

## 2014-06-02 DIAGNOSIS — F0390 Unspecified dementia without behavioral disturbance: Secondary | ICD-10-CM | POA: Insufficient documentation

## 2014-06-02 DIAGNOSIS — R296 Repeated falls: Secondary | ICD-10-CM | POA: Insufficient documentation

## 2014-06-02 DIAGNOSIS — S59909A Unspecified injury of unspecified elbow, initial encounter: Secondary | ICD-10-CM | POA: Diagnosis present

## 2014-06-02 DIAGNOSIS — Y9289 Other specified places as the place of occurrence of the external cause: Secondary | ICD-10-CM | POA: Insufficient documentation

## 2014-06-02 DIAGNOSIS — M129 Arthropathy, unspecified: Secondary | ICD-10-CM | POA: Insufficient documentation

## 2014-06-02 DIAGNOSIS — Z79899 Other long term (current) drug therapy: Secondary | ICD-10-CM | POA: Insufficient documentation

## 2014-06-02 DIAGNOSIS — Y939 Activity, unspecified: Secondary | ICD-10-CM | POA: Insufficient documentation

## 2014-06-02 DIAGNOSIS — W19XXXA Unspecified fall, initial encounter: Secondary | ICD-10-CM

## 2014-06-02 DIAGNOSIS — I10 Essential (primary) hypertension: Secondary | ICD-10-CM | POA: Diagnosis not present

## 2014-06-02 DIAGNOSIS — S51009A Unspecified open wound of unspecified elbow, initial encounter: Secondary | ICD-10-CM | POA: Insufficient documentation

## 2014-06-02 DIAGNOSIS — S59901A Unspecified injury of right elbow, initial encounter: Secondary | ICD-10-CM

## 2014-06-02 DIAGNOSIS — E079 Disorder of thyroid, unspecified: Secondary | ICD-10-CM | POA: Insufficient documentation

## 2014-06-02 DIAGNOSIS — S51011A Laceration without foreign body of right elbow, initial encounter: Secondary | ICD-10-CM

## 2014-06-02 NOTE — ED Notes (Signed)
Son reports fall from standing landing on hardwood floor x 30 mins ago. Laceration to right elbow

## 2014-06-02 NOTE — ED Provider Notes (Signed)
CSN: [id number]     Arrival date & time 06/02/14  2152 History   First MD Initiated Contact with Patient 06/02/14 2203     Chief Complaint  Patient presents with  . Fall     (Consider location/radiation/quality/duration/timing/severity/associated sxs/prior Treatment) HPI Comments: Patient is a 80 year old female with a past medical history of dementia who presents after a witnessed fall that occurred prior to arrival. Patient's son and daughter-in-law are present and provide the history. Patient standing next to a chair when lost her balance and fell on her right side. Patient has been in pain since the fall. She has been unable to bear weight on her right leg since the fall. She normally is able to ambulate on both legs. Patient also has a laceration on her right elbow. The family denies any head trauma or LOC. No other injuries.     Past Medical History  Diagnosis Date  . Thyroid disease   . Hypertension   . Arthritis   . Dementia    Past Surgical History  Procedure Laterality Date  . Knee surgery     History reviewed. No pertinent family history. History  Substance Use Topics  . Smoking status: Never Smoker   . Smokeless tobacco: Never Used  . Alcohol Use: No   OB History   Grav Para Term Preterm Abortions TAB SAB Ect Mult Living                 Review of Systems  Unable to perform ROS: Dementia      Allergies  Codeine and Ibuprofen  Home Medications   Prior to Admission medications   Medication Sig Start Date End Date Taking? Authorizing Provider  acetaminophen (TYLENOL) 500 MG tablet Take 500 mg by mouth daily.    Historical Provider, MD  busPIRone (BUSPAR) 5 MG tablet Take 5 mg by mouth 3 (three) times daily.    Historical Provider, MD  calcium-vitamin D 250-100 MG-UNIT per tablet Take 1 tablet by mouth 2 (two) times daily.    Historical Provider, MD  levothyroxine (SYNTHROID, LEVOTHROID) 50 MCG tablet Take 75 mcg by mouth daily.     Historical Provider, MD   losartan (COZAAR) 25 MG tablet Take 25 mg by mouth daily.    Historical Provider, MD  Multiple Vitamin (MULTIVITAMIN) tablet Take 1 tablet by mouth daily.    Historical Provider, MD  simvastatin (ZOCOR) 20 MG tablet Take 40 mg by mouth every evening.     Historical Provider, MD   BP 135/63  Pulse 73  Temp(Src) 97.7 F (36.5 C) (Oral)  Resp 16  SpO2 98% Physical Exam  Nursing note and vitals reviewed. Constitutional: She appears well-developed and well-nourished. No distress.  HENT:  Head: Normocephalic and atraumatic.  Eyes: Conjunctivae and EOM are normal. Pupils are equal, round, and reactive to light.  Neck: Normal range of motion.  Cardiovascular: Normal rate and regular rhythm.  Exam reveals no gallop and no friction rub.   No murmur heard. Pulmonary/Chest: Effort normal and breath sounds normal. She has no wheezes. She has no rales. She exhibits no tenderness.  Abdominal: Soft. She exhibits no distension. There is no tenderness. There is no rebound.  Musculoskeletal: Normal range of motion.  No hip tenderness to palpation. Stable pelvis. Patient will not bear weight on her right leg. There is external rotation of her right leg. Limited ROM of right hip due to pain. No obvious deformity.   Right elbow tender to palpation. No obvious  deformity. Limited ROM due to pain.   Neurological: Coordination normal.  Patient severely demented.   Skin: Skin is warm and dry.  Deep, V-shaped laceration of posterior right arm that measures about 5cm on each side.  Psychiatric: She has a normal mood and affect. Her behavior is normal.    ED Course  Procedures (including critical care time)  LACERATION REPAIR Performed by: Emilia BeckKaitlyn Sharisse Rantz Authorized by: Emilia BeckKaitlyn Torsha Lemus Consent: Verbal consent obtained. Risks and benefits: risks, benefits and alternatives were discussed Consent given by: patient Patient identity confirmed: provided demographic data Prepped and Draped in normal  sterile fashion Wound explored  Laceration Location: posterior right elbow  Laceration Length: 10 cm total (v-shaped, 5cm each side)  No Foreign Bodies seen or palpated  Anesthesia: local infiltration  Local anesthetic: lidocaine 2% without  epinephrine  Anesthetic total: 8 ml  Irrigation method: syringe Amount of cleaning: standard  Skin closure: 5-0 prolene  Number of sutures: 11  Technique: simple  Patient tolerance: Patient tolerated the procedure well with no immediate complications.   Labs Review Labs Reviewed - No data to display  Imaging Review Dg Elbow Complete Right  06/02/2014   CLINICAL DATA:  Fall.  Elbow pain.  EXAM: RIGHT ELBOW - COMPLETE 3+ VIEW  COMPARISON:  None.  FINDINGS: Suboptimal lateral view because the arm is oblique. There is soft tissue irregularity in the dorsal elbow, suggesting laceration. Radial head appears intact. Allowing for projection, the alignment appears within normal limits. Calcification is present along the lateral aspect of the elbow joint, probably representing lateral stabilizer (lateral ulnar collateral ligament/ radial collateral ligament) calcification and prior injury.  IMPRESSION: No acute osseous injury identified. Soft tissue irregularity over the dorsal aspect the elbow compatible with laceration.   Electronically Signed   By: Andreas NewportGeoffrey  Lamke M.D.   On: 06/02/2014 23:02   Dg Hip Complete Right  06/02/2014   CLINICAL DATA:  Status post fall; injury to right hip.  EXAM: RIGHT HIP - COMPLETE 2+ VIEW  COMPARISON:  None.  FINDINGS: There is no evidence of fracture or dislocation. Both femoral heads are seated normally within their respective acetabula. The proximal right femur appears intact. Mild superior joint space narrowing is noted at both hips, possibly reflecting mild degenerative osteoarthritis. The sacroiliac joints are unremarkable in appearance. Mild degenerative changes noted at the lower lumbar spine.  The visualized  bowel gas pattern is grossly unremarkable in appearance. The gallbladder is filled with stones.  IMPRESSION: 1. No evidence of fracture or dislocation. 2. Cholelithiasis incidentally noted.   Electronically Signed   By: Roanna RaiderJeffery  Chang M.D.   On: 06/02/2014 23:42     EKG Interpretation None      MDM   Final diagnoses:  Fall  Injury of right elbow  Laceration of right elbow   11:24 PM Xrays pending. Vitals stable and patient afebrile. Patient has Alzheimer's and is currently no alert due to sundowning. No signs of head injury and no LOC.   12:58 AM Xrays unremarkable for acute changes. Patient's laceration repaired without difficulty. Patient will be discharged. Plan discussed with Dr. Rhunette CroftNanavati who also saw the patient and agrees with the plan.     Emilia BeckKaitlyn Ligaya Cormier, PA-C 06/03/14 0100

## 2014-06-03 ENCOUNTER — Emergency Department (HOSPITAL_BASED_OUTPATIENT_CLINIC_OR_DEPARTMENT_OTHER): Payer: Medicare Other

## 2014-06-03 NOTE — Discharge Instructions (Signed)
Keep wound clean. Follow up with your doctor in 10 days for suture removal. Refer to attached documents for more information.  °

## 2014-06-03 NOTE — ED Notes (Signed)
Pt ambulated 20 feet with stand by assist. Able to get up and down from sitting position independently

## 2014-06-03 NOTE — ED Provider Notes (Signed)
Shared service with midlevel provider. I have personally seen and examined the patient, providing direct face to face care, presenting with the chief complaint of fall. Fall is mechanical. Fall was broken by son in law, and he assured that patient didn't hit her hear. Pt deneis headaches, nausea, confusion, seizures. Physical exam findings include right elbow laceration with skin loss. Plan will be irrigate the wound extensively, as it is deep, and exposing fascial layer and have the wound sutured. Andee PolesKailyn will be performing those tasks. I have reviewed the nursing documentation on past medical history, family history, and social history.   Derwood KaplanAnkit Jobin Montelongo, MD 06/03/14 [phone]

## 2014-07-16 ENCOUNTER — Encounter: Payer: Self-pay | Admitting: Podiatry

## 2014-07-16 ENCOUNTER — Ambulatory Visit (INDEPENDENT_AMBULATORY_CARE_PROVIDER_SITE_OTHER): Payer: Medicare Other | Admitting: Podiatry

## 2014-07-16 VITALS — BP 131/58 | HR 81

## 2014-07-16 DIAGNOSIS — I739 Peripheral vascular disease, unspecified: Secondary | ICD-10-CM

## 2014-07-16 DIAGNOSIS — L8992 Pressure ulcer of unspecified site, stage 2: Secondary | ICD-10-CM

## 2014-07-16 DIAGNOSIS — L8990 Pressure ulcer of unspecified site, unspecified stage: Secondary | ICD-10-CM

## 2014-07-16 NOTE — Patient Instructions (Signed)
Seen for painful feet. All calluses and nails debrided. Pad dispensed. Return in 3 months or as needed.

## 2014-07-16 NOTE — Progress Notes (Signed)
Subjective:  [id number] year old female presents with her daughter for follow up on right ankle ulcer.  Daughter stated that she needs more pad. The area is very painful to her mother.   Objective: Right ankle ulcer increased in size of scab. It is dark, mostly dry and about 0.8cm. Hard callus under 1st MPJ right foot is pre ulcerative with intra demal bleeding. Pedal pulses are not palpable.  Dystrophic nails x 10.   Assessment: Right lateral ankle lesion increased in size. Pre ulcerative callus under first MPJ right foot.  PVD.  All nails are thick and hypertrophic.  Plan: Continue with current level of care.  All calluses and nails debrided.  Aperture pad placed under first MPJ and right lateral ankle.  Supply pad 1/4" thickness felt pad for right lateral ankle dispensed x 2.  Return in 6 weeks.

## 2014-10-17 ENCOUNTER — Ambulatory Visit: Payer: Medicare Other | Admitting: Podiatry

## 2015-10-20 DEATH — deceased

## 2015-12-08 IMAGING — CT CT HEAD W/O CM
1 series · 15 of 30 positions shown, 19 images · non-contrast
Comparison: No priors.

CLINICAL DATA: Dementia.  Leaning to the right.

EXAM:
CT HEAD WITHOUT CONTRAST
TECHNIQUE: Contiguous axial images were obtained from the base of the skull
through the vertex without intravenous contrast.

[Series 2: head 4.8 h37s · axial · 0.43mm/px · z∈[-147,-10]mm · 15 of 32 slices shown, 19 images]
[im 2/32  brain]
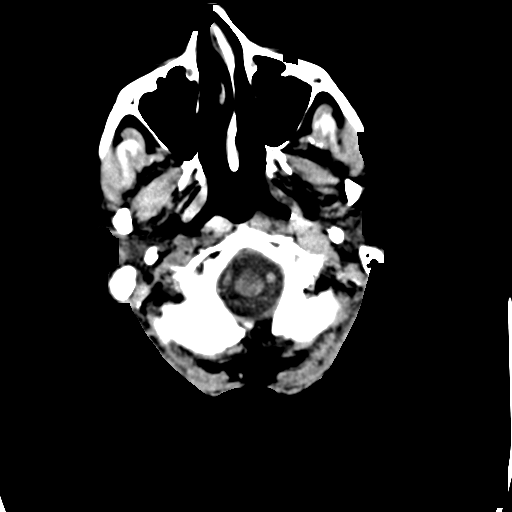
[im 2/32  bone]
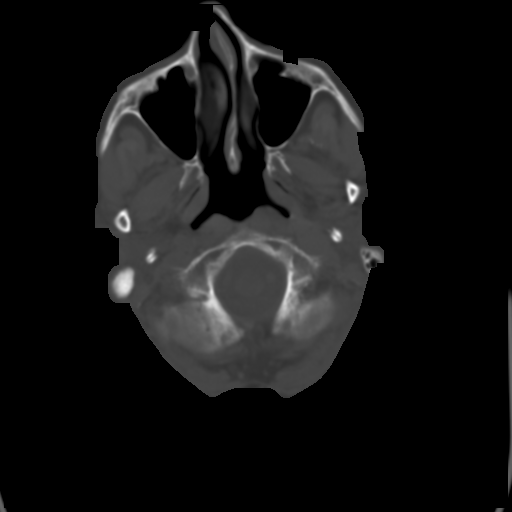
[im 4/32  brain]
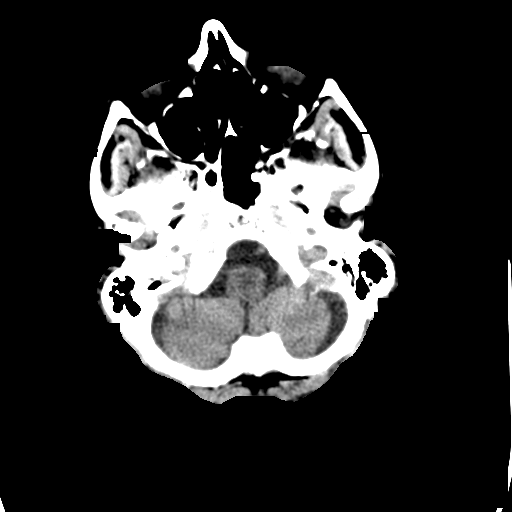
[im 6/32  brain]
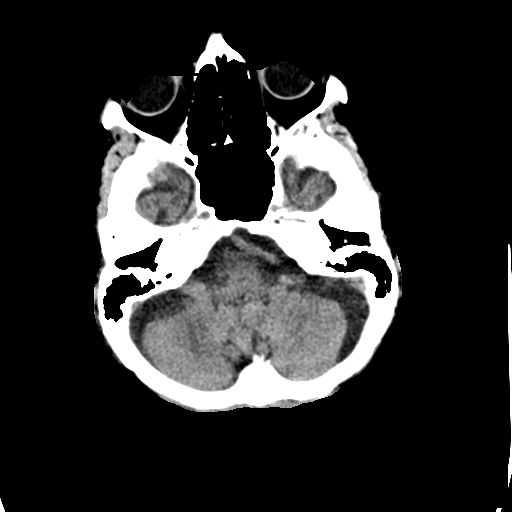
[im 8/32  brain]
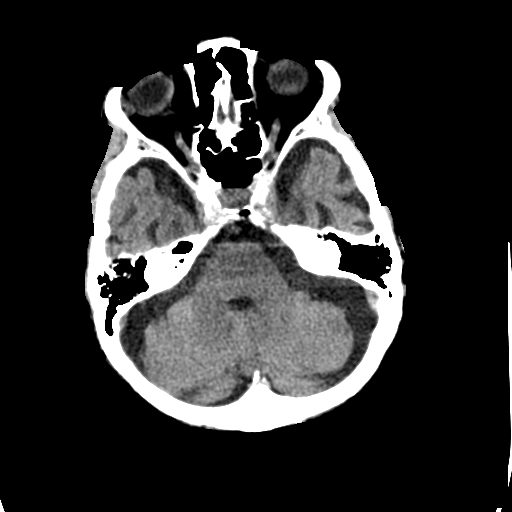
[im 10/32  brain]
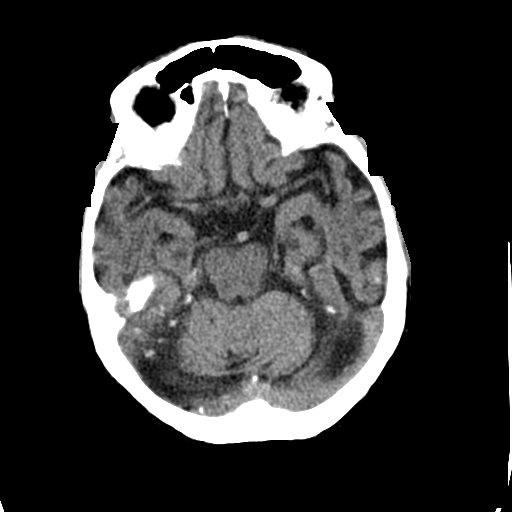
[im 10/32  bone]
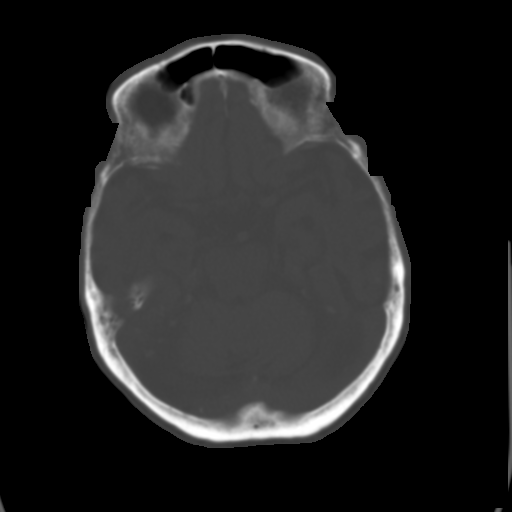
[im 12/32  brain]
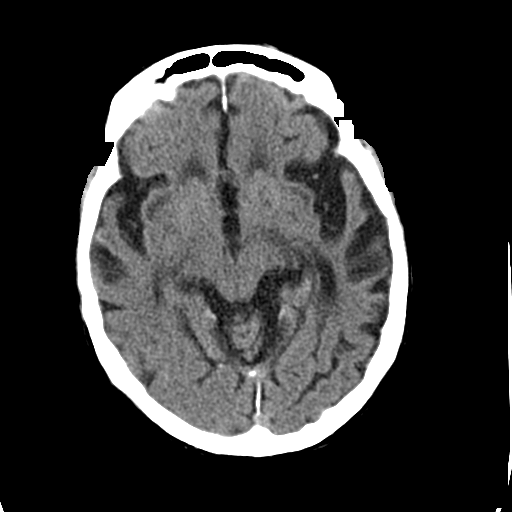
[im 14/32  brain]
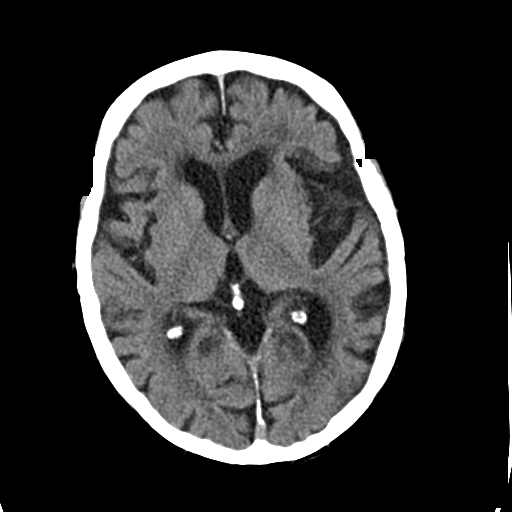
[im 17/32  brain]
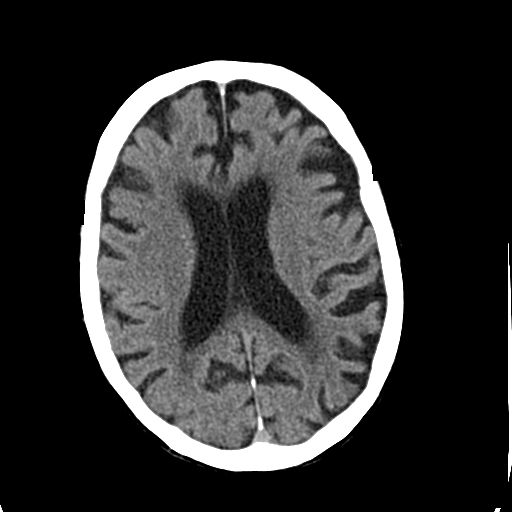
[im 18/32  brain]
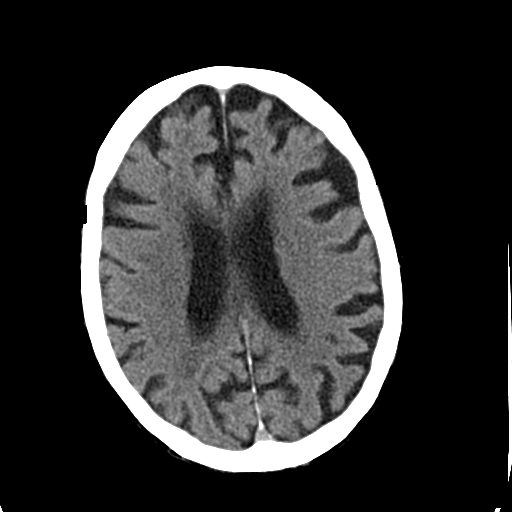
[im 18/32  bone]
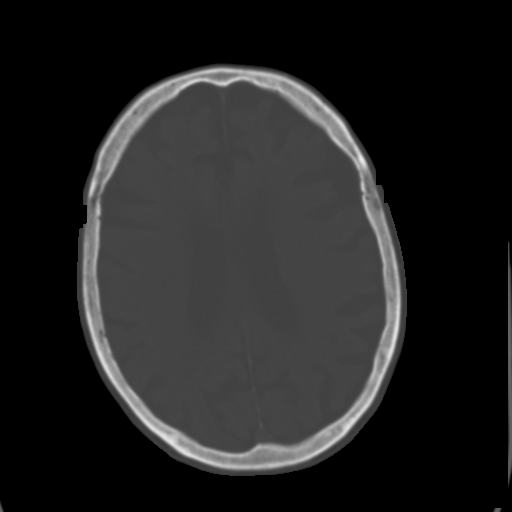
[im 20/32  brain]
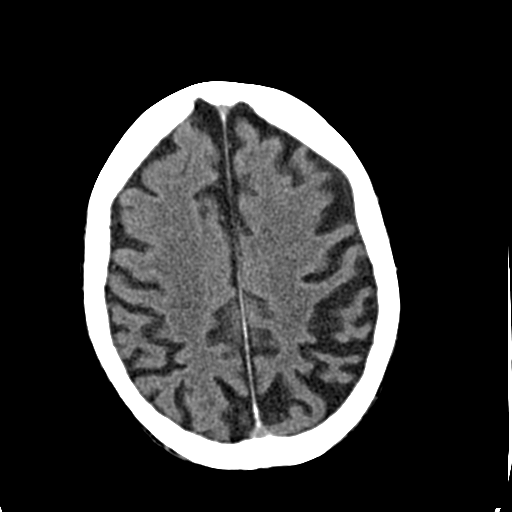
[im 22/32  brain]
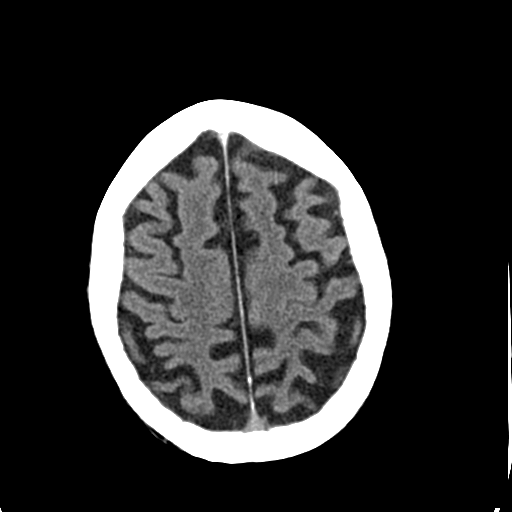
[im 24/32  brain]
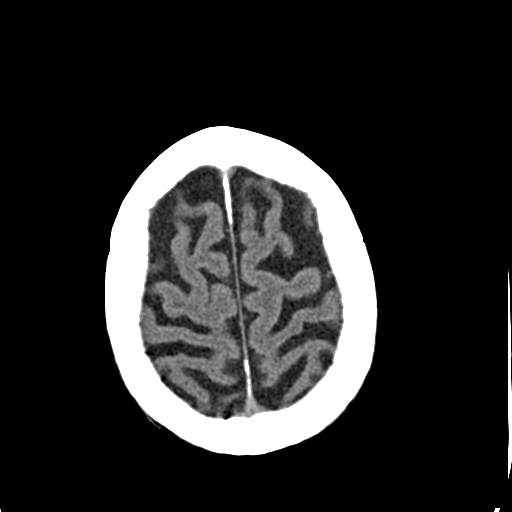
[im 26/32  brain]
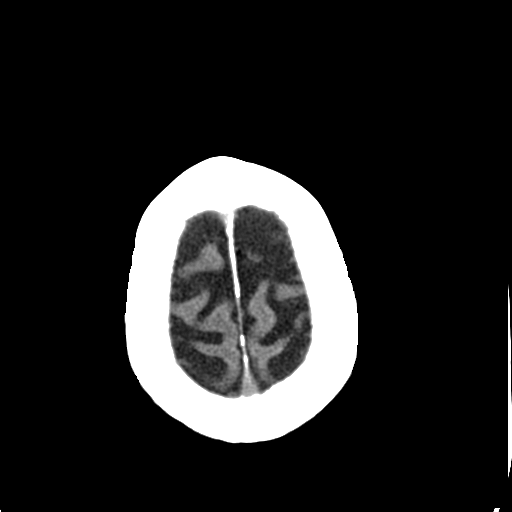
[im 26/32  bone]
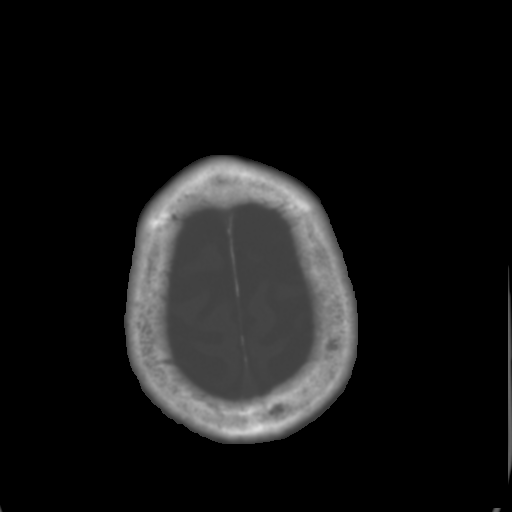
[im 28/32  brain]
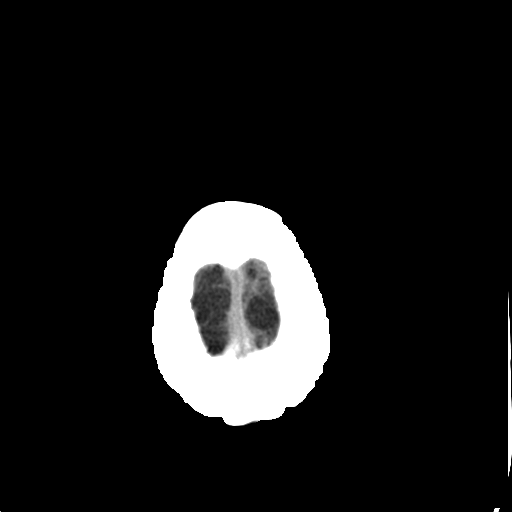
[im 30/32  brain]
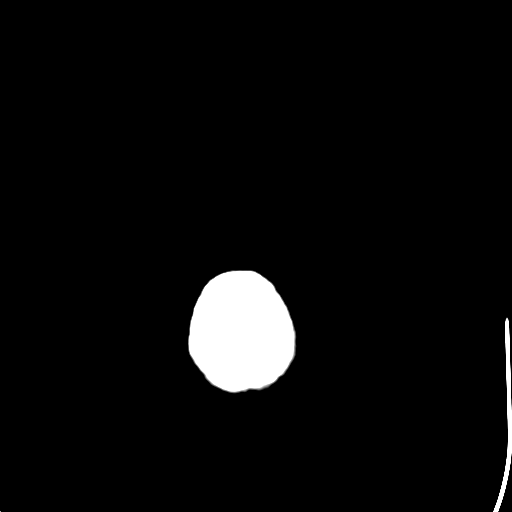

[15 of 30 positions shown; findings below may reference images not displayed]

FINDINGS: Moderate cerebral and cerebellar atrophy. Patchy and confluent areas
of decreased attenuation are noted throughout the deep and
periventricular white matter of the cerebral hemispheres
bilaterally, compatible with chronic microvascular ischemic disease.
Multifocal dural calcifications are incidentally noted. No acute
intracranial abnormalities. Specifically, no evidence of acute
intracranial hemorrhage, no definite findings of acute/subacute
cerebral ischemia, no mass, mass effect, hydrocephalus or abnormal
intra or extra-axial fluid collections. Visualized paranasal sinuses
and mastoids are well pneumatized. No acute displaced skull
fractures are identified.
IMPRESSION: 1. No acute intracranial abnormalities.
2. Moderate cerebral and cerebellar atrophy with chronic
microvascular ischemic changes of the cerebral white matter.

## 2016-02-09 IMAGING — CR DG HIP COMPLETE 2+V*R*
3 series · 3 of 3 positions shown · non-contrast
Comparison: None.

CLINICAL DATA: Status post fall; injury to right hip.

EXAM:
RIGHT HIP - COMPLETE 2+ VIEW

[t hip frog leg right]
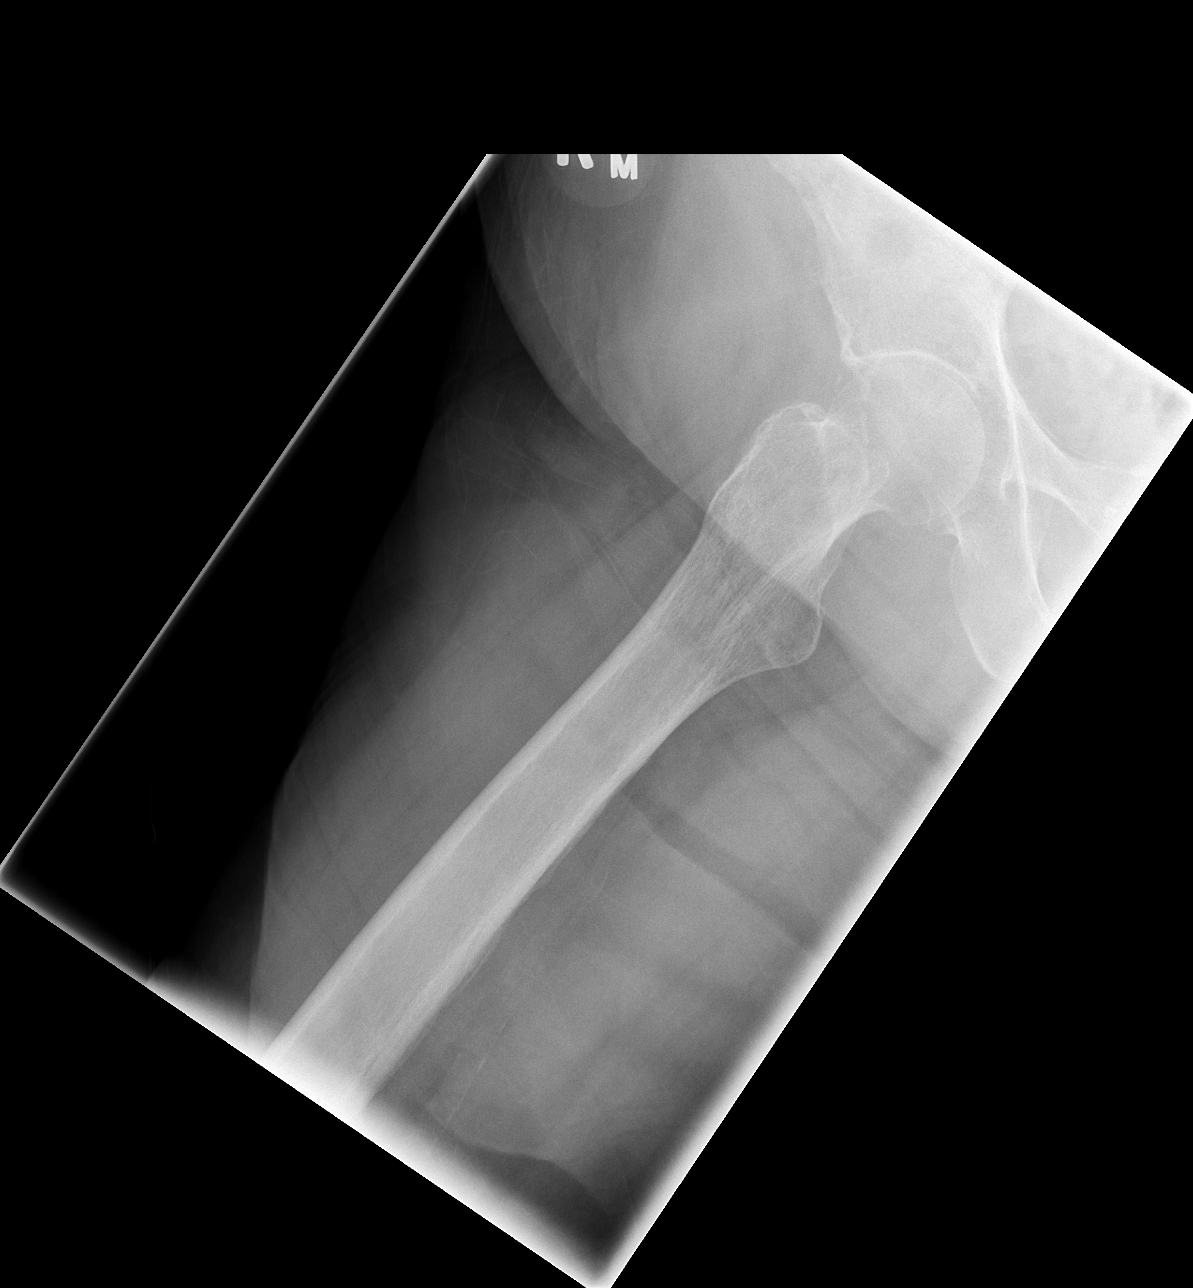

[t pelvis a.p.]
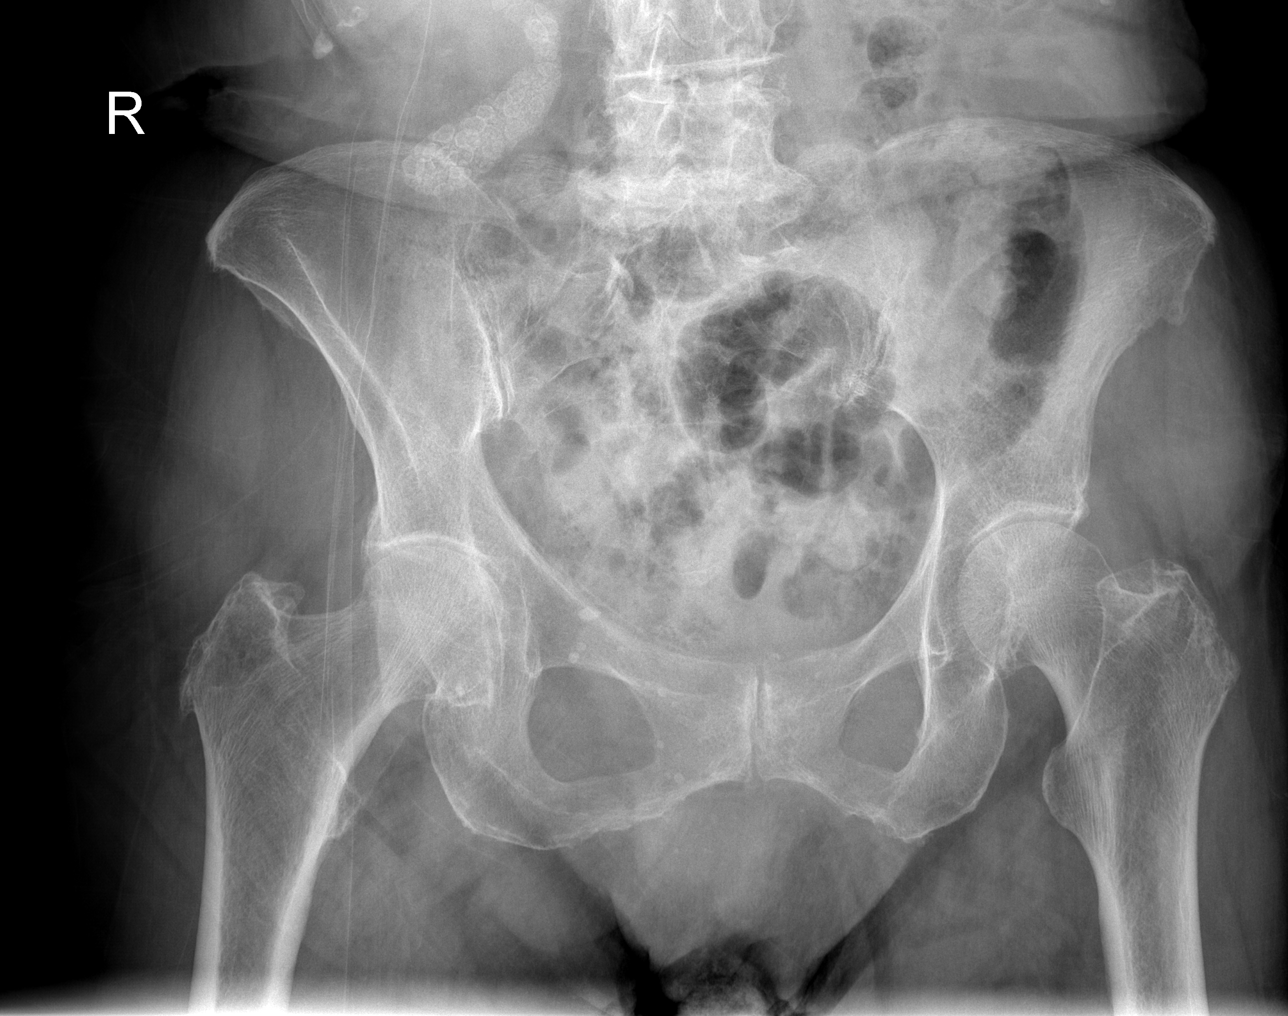

[t hip ap right]
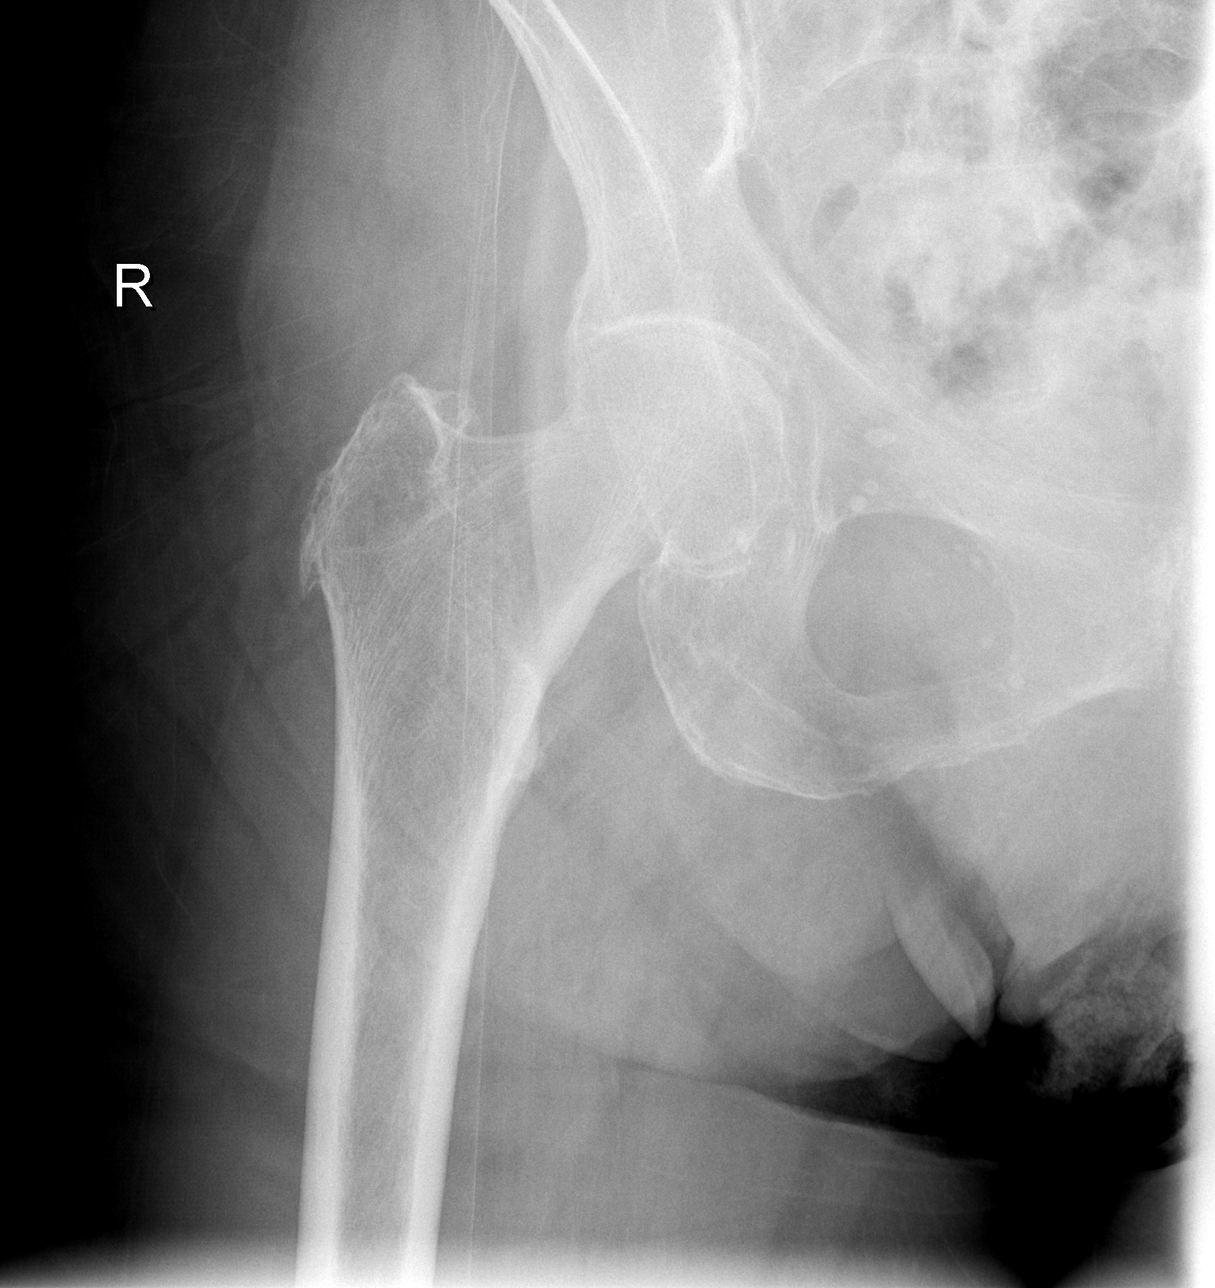

[3 of 3 positions shown; findings below may reference images not displayed]

FINDINGS: There is no evidence of fracture or dislocation. Both femoral heads
are seated normally within their respective acetabula. The proximal
right femur appears intact. Mild superior joint space narrowing is
noted at both hips, possibly reflecting mild degenerative
osteoarthritis. The sacroiliac joints are unremarkable in
appearance. Mild degenerative changes noted at the lower lumbar
spine.

The visualized bowel gas pattern is grossly unremarkable in
appearance. The gallbladder is filled with stones.
IMPRESSION: 1. No evidence of fracture or dislocation.
2. Cholelithiasis incidentally noted.
